# Patient Record
Sex: Female | Born: 1975 | Hispanic: No | Marital: Married | Smoking: Never smoker
Health system: Southern US, Community
[De-identification: ages and names within clinical notes are randomized; demographics above are authoritative.]

## PROBLEM LIST (undated history)

## (undated) ENCOUNTER — Emergency Department (HOSPITAL_COMMUNITY): Payer: No Typology Code available for payment source | Source: Home / Self Care

## (undated) DIAGNOSIS — D249 Benign neoplasm of unspecified breast: Secondary | ICD-10-CM

## (undated) DIAGNOSIS — N979 Female infertility, unspecified: Secondary | ICD-10-CM

## (undated) HISTORY — DX: Female infertility, unspecified: N97.9

## (undated) HISTORY — PX: WISDOM TOOTH EXTRACTION: SHX21

## (undated) HISTORY — DX: Benign neoplasm of unspecified breast: D24.9

## (undated) HISTORY — PX: BREAST CYST ASPIRATION: SHX578

---

## 2009-10-06 ENCOUNTER — Ambulatory Visit: Payer: Self-pay | Admitting: Family Medicine

## 2009-10-06 LAB — CONVERTED CEMR LAB
ALT: 8 units/L (ref 0–35)
AST: 15 units/L (ref 0–37)
Basophils Relative: 1 % (ref 0–1)
Calcium: 9.2 mg/dL (ref 8.4–10.5)
Chloride: 107 meq/L (ref 96–112)
Creatinine, Ser: 0.59 mg/dL (ref 0.40–1.20)
Glucose, Bld: 93 mg/dL (ref 70–99)
HCT: 41.7 % (ref 36.0–46.0)
Hemoglobin: 14.3 g/dL (ref 12.0–15.0)
Lymphocytes Relative: 42 % (ref 12–46)
Lymphs Abs: 2.1 10*3/uL (ref 0.7–4.0)
MCV: 84.6 fL (ref 78.0–100.0)
Monocytes Absolute: 0.4 10*3/uL (ref 0.1–1.0)
Neutro Abs: 2.3 10*3/uL (ref 1.7–7.7)
Neutrophils Relative %: 45 % (ref 43–77)
Prolactin: 26.1 ng/mL
Sodium: 138 meq/L (ref 135–145)
Total Bilirubin: 0.6 mg/dL (ref 0.3–1.2)
WBC: 5 10*3/uL (ref 4.0–10.5)

## 2009-10-25 ENCOUNTER — Encounter (INDEPENDENT_AMBULATORY_CARE_PROVIDER_SITE_OTHER): Payer: Self-pay | Admitting: Family Medicine

## 2009-10-25 LAB — CONVERTED CEMR LAB: Free T4: 0.99 ng/dL (ref 0.80–1.80)

## 2009-12-23 ENCOUNTER — Ambulatory Visit: Payer: Self-pay | Admitting: Obstetrics & Gynecology

## 2009-12-23 LAB — CONVERTED CEMR LAB: Pap Smear: NEGATIVE

## 2009-12-29 ENCOUNTER — Ambulatory Visit (HOSPITAL_COMMUNITY)
Admission: RE | Admit: 2009-12-29 | Discharge: 2009-12-29 | Payer: Self-pay | Source: Home / Self Care | Attending: Family Medicine | Admitting: Family Medicine

## 2009-12-29 ENCOUNTER — Encounter
Admission: RE | Admit: 2009-12-29 | Discharge: 2009-12-29 | Payer: Self-pay | Source: Home / Self Care | Attending: Obstetrics & Gynecology | Admitting: Obstetrics & Gynecology

## 2010-03-03 ENCOUNTER — Ambulatory Visit: Payer: Self-pay | Admitting: Obstetrics and Gynecology

## 2010-03-13 LAB — POCT PREGNANCY, URINE: Preg Test, Ur: NEGATIVE

## 2010-07-28 DIAGNOSIS — N946 Dysmenorrhea, unspecified: Secondary | ICD-10-CM | POA: Insufficient documentation

## 2010-07-28 DIAGNOSIS — IMO0002 Reserved for concepts with insufficient information to code with codable children: Secondary | ICD-10-CM | POA: Insufficient documentation

## 2010-07-28 DIAGNOSIS — E669 Obesity, unspecified: Secondary | ICD-10-CM

## 2010-08-07 ENCOUNTER — Encounter (INDEPENDENT_AMBULATORY_CARE_PROVIDER_SITE_OTHER): Payer: Self-pay | Admitting: Family Medicine

## 2010-08-10 NOTE — Progress Notes (Signed)
This encounter was created in error - please disregard.

## 2012-01-15 ENCOUNTER — Encounter: Payer: Self-pay | Admitting: Obstetrics and Gynecology

## 2012-02-26 ENCOUNTER — Encounter: Payer: Self-pay | Admitting: Obstetrics and Gynecology

## 2012-02-26 ENCOUNTER — Ambulatory Visit: Payer: Self-pay | Admitting: Obstetrics and Gynecology

## 2012-02-26 VITALS — BP 130/82 | Wt 164.0 lb

## 2012-02-26 DIAGNOSIS — N979 Female infertility, unspecified: Secondary | ICD-10-CM

## 2012-02-26 NOTE — Progress Notes (Signed)
Subjective:    Morgan Blankenship is a 37 y.o. female, G0P0, who presents for infertility pt stated last pap was in 2013 wnl .trying to conceive 2006.  Has never used birth control.   Had menses every 29 days, but for the last year have been q 26, 28 or 30 days.  No IM bleeding.  Severe menstrual cramps unrelieved by OTC meds.  Had evaluation in [place] that sounds like HSG that was said to be fine, but no records available.  The following portions of the patient's history were reviewed and updated as appropriate: allergies, current medications, past family history.  Review of Systems Pertinent items are noted in HPI. Breast:Negative for breast lump,nipple discharge or nipple retraction Gastrointestinal: Negative for abdominal pain, change in bowel habits or rectal bleeding Urinary:negative   Objective:    BP 130/82  Wt 164 lb (74.39 kg)  LMP 02/02/2012    Weight:  Wt Readings from Last 1 Encounters:  02/26/12 164 lb (74.39 kg)          BMI: There is no height on file to calculate BMI.  General Appearance: Alert, appropriate appearance for age. No acute distress Breasts:  No masses, no discharge, no skin changes Abdomen:  No masses or organomegaly.  No tenderness GYN exam: Pelvic exam:   VULVA: s/p circumcision, well healed,    VAGINA: normal appearing vagina with normal color and discharge, no lesions,    CERVIX: normal appearing cervix without discharge or lesions,    UTERUS: uterus is normal size, shape, consistency and nontender,    ADNEXA: normal adnexa in size, nontender and no masses, exam limited by patient anxiety.   Assessment:    Primary infertility  AMA Irregular menses Possible anovulation Hx nl pap at [facility]   Plan:   Prolactin TSH Progesterone  GC/CHL F/u 2-3 wks for lab review

## 2012-02-27 LAB — GC/CHLAMYDIA PROBE AMP, URINE
Chlamydia, Swab/Urine, PCR: NEGATIVE
GC Probe Amp, Urine: NEGATIVE

## 2012-02-27 LAB — PROLACTIN: Prolactin: 25.9 ng/mL

## 2012-02-27 LAB — TSH: TSH: 110.171 u[IU]/mL — ABNORMAL HIGH (ref 0.350–4.500)

## 2012-02-29 ENCOUNTER — Other Ambulatory Visit: Payer: Self-pay

## 2012-02-29 ENCOUNTER — Telehealth: Payer: Self-pay | Admitting: Obstetrics and Gynecology

## 2012-02-29 MED ORDER — LEVOTHYROXINE SODIUM 25 MCG PO TABS: 25.0000 ug | ORAL_TABLET | Freq: Every day | ORAL | Status: DC

## 2012-02-29 NOTE — Telephone Encounter (Signed)
CW addressing  Darien Ramus, CMA

## 2012-09-20 ENCOUNTER — Ambulatory Visit (INDEPENDENT_AMBULATORY_CARE_PROVIDER_SITE_OTHER): Payer: BC Managed Care – PPO | Admitting: Family Medicine

## 2012-09-20 VITALS — BP 116/64 | HR 88 | Temp 99.3°F | Resp 17 | Ht 62.0 in | Wt 174.6 lb

## 2012-09-20 DIAGNOSIS — H9202 Otalgia, left ear: Secondary | ICD-10-CM

## 2012-09-20 DIAGNOSIS — H6690 Otitis media, unspecified, unspecified ear: Secondary | ICD-10-CM

## 2012-09-20 DIAGNOSIS — H6692 Otitis media, unspecified, left ear: Secondary | ICD-10-CM

## 2012-09-20 DIAGNOSIS — H9209 Otalgia, unspecified ear: Secondary | ICD-10-CM

## 2012-09-20 MED ORDER — AMOXICILLIN 500 MG PO CAPS: 1000.0000 mg | ORAL_CAPSULE | Freq: Two times a day (BID) | ORAL | Status: DC

## 2012-09-20 NOTE — Patient Instructions (Addendum)

## 2012-09-20 NOTE — Progress Notes (Signed)
   [address]   [place], [place]  [address]   [phone]  Subjective:    Patient ID: Morgan Blankenship, female    DOB: 10/29/1975, 37 y.o.   MRN: [id number]  HPI This 37 y.o. female presents for evaluation of L ear pain.  Onset two days ago.  Had some pain four months ago but improved.  No fever/chills/sweats.  +HA.  No drainage from L ear.  Normal hearing.  No rhinorrhea or nasal congestion.  No sore throat.  No rash.  One Ibuprofen today with improvement.  PCP:  [facility]   Review of Systems  Constitutional: Negative for fever, chills, diaphoresis and fatigue.  HENT: Positive for ear pain. Negative for hearing loss, congestion, sore throat, rhinorrhea, sneezing, neck pain, neck stiffness, postnasal drip and ear discharge.   Respiratory: Negative for cough.    Past Medical History  Diagnosis Date  . Infertility, female   . Fibroadenoma of breast    Past Surgical History  Procedure Laterality Date  . Breast cyst aspiration    . Wisdom tooth extraction     No Known Allergies Current Outpatient Prescriptions on File Prior to Visit  Medication Sig Dispense Refill  . acetaminophen (TYLENOL) 325 MG tablet Take 650 mg by mouth every 6 (six) hours as needed.        Marland Kitchen ibuprofen (ADVIL,MOTRIN) 200 MG tablet Take 200 mg by mouth every 6 (six) hours as needed.        Marland Kitchen levothyroxine (LEVOTHROID) 25 MCG tablet Take 1 tablet (25 mcg total) by mouth daily.  30 tablet  1   No current facility-administered medications on file prior to visit.       Objective:   Physical Exam  Nursing note and vitals reviewed. Constitutional: She is oriented to person, place, and time. She appears well-developed and well-nourished. No distress.  HENT:  Head: Normocephalic and atraumatic.  Right Ear: Tympanic membrane, external ear and ear canal normal.  Left Ear: No drainage or swelling. No foreign bodies. Tympanic membrane is injected and erythematous. Tympanic membrane is not scarred, not perforated, not  retracted and not bulging. A middle ear effusion is present. No decreased hearing is noted.  Nose: Nose normal.  Mouth/Throat: Oropharynx is clear and moist. No oropharyngeal exudate.  Eyes: Conjunctivae and EOM are normal. Pupils are equal, round, and reactive to light.  Neck: Normal range of motion.  L post-auricular LAD.  Cardiovascular: Normal rate and regular rhythm.   Pulmonary/Chest: Effort normal and breath sounds normal.  Lymphadenopathy:    She has cervical adenopathy.  Neurological: She is alert and oriented to person, place, and time.  Skin: Skin is warm and dry. No rash noted. She is not diaphoretic.  Psychiatric: She has a normal mood and affect. Her behavior is normal.       Assessment & Plan:  Otalgia, left  Otitis media, left  1. L otalgia/otitis media;  New. Rx for Amoxicillin provided; continue Ibuprofen but increase to 400mg  PRN pain.  RTC for acute worsening, decreased hearing, drainage from ear.  Meds ordered this encounter  Medications  . amoxicillin (AMOXIL) 500 MG capsule    Sig: Take 2 capsules (1,000 mg total) by mouth 2 (two) times daily.    Dispense:  40 capsule    Refill:  0

## 2013-04-16 ENCOUNTER — Other Ambulatory Visit: Payer: Self-pay | Admitting: Obstetrics and Gynecology

## 2013-04-16 DIAGNOSIS — Z3141 Encounter for fertility testing: Secondary | ICD-10-CM

## 2013-04-22 ENCOUNTER — Encounter (INDEPENDENT_AMBULATORY_CARE_PROVIDER_SITE_OTHER): Payer: Self-pay

## 2013-04-22 ENCOUNTER — Ambulatory Visit (HOSPITAL_COMMUNITY)
Admission: RE | Admit: 2013-04-22 | Discharge: 2013-04-22 | Disposition: A | Discharge status: Home / Self Care | Payer: No Typology Code available for payment source | Source: Ambulatory Visit | Attending: Obstetrics and Gynecology | Admitting: Obstetrics and Gynecology

## 2013-04-22 DIAGNOSIS — Z3141 Encounter for fertility testing: Secondary | ICD-10-CM

## 2013-04-22 DIAGNOSIS — N979 Female infertility, unspecified: Secondary | ICD-10-CM | POA: Insufficient documentation

## 2013-04-22 MED ORDER — IOHEXOL 300 MG/ML  SOLN
20.0000 mL | Freq: Once | INTRAMUSCULAR | Status: AC | PRN
  Administered 2013-04-22: 20 mL

## 2016-08-16 ENCOUNTER — Emergency Department (HOSPITAL_COMMUNITY)
Admission: EM | Admit: 2016-08-16 | Discharge: 2016-08-16 | Disposition: A | Discharge status: Home / Self Care | Payer: Self-pay | Attending: Emergency Medicine | Admitting: Emergency Medicine

## 2016-08-16 ENCOUNTER — Emergency Department (HOSPITAL_COMMUNITY): Payer: Self-pay

## 2016-08-16 ENCOUNTER — Encounter (HOSPITAL_COMMUNITY): Payer: Self-pay

## 2016-08-16 DIAGNOSIS — L0231 Cutaneous abscess of buttock: Secondary | ICD-10-CM | POA: Insufficient documentation

## 2016-08-16 LAB — CBC WITH DIFFERENTIAL/PLATELET
Basophils Absolute: 0 10*3/uL (ref 0.0–0.1)
Basophils Relative: 0 %
EOS ABS: 0.1 10*3/uL (ref 0.0–0.7)
EOS PCT: 1 %
HCT: 39.5 % (ref 36.0–46.0)
HEMOGLOBIN: 13.6 g/dL (ref 12.0–15.0)
LYMPHS ABS: 2.2 10*3/uL (ref 0.7–4.0)
Lymphocytes Relative: 25 %
MCH: 28.5 pg (ref 26.0–34.0)
MCHC: 34.4 g/dL (ref 30.0–36.0)
MCV: 82.6 fL (ref 78.0–100.0)
MONO ABS: 0.7 10*3/uL (ref 0.1–1.0)
MONOS PCT: 7 %
Neutro Abs: 6 10*3/uL (ref 1.7–7.7)
Neutrophils Relative %: 67 %
Platelets: 387 10*3/uL (ref 150–400)
RBC: 4.78 MIL/uL (ref 3.87–5.11)
RDW: 13.7 % (ref 11.5–15.5)
WBC: 9 10*3/uL (ref 4.0–10.5)

## 2016-08-16 LAB — I-STAT BETA HCG BLOOD, ED (MC, WL, AP ONLY)

## 2016-08-16 LAB — COMPREHENSIVE METABOLIC PANEL
ALT: 12 U/L — AB (ref 14–54)
AST: 24 U/L (ref 15–41)
Albumin: 3.8 g/dL (ref 3.5–5.0)
Alkaline Phosphatase: 98 U/L (ref 38–126)
Anion gap: 9 (ref 5–15)
BUN: 7 mg/dL (ref 6–20)
CHLORIDE: 107 mmol/L (ref 101–111)
CO2: 22 mmol/L (ref 22–32)
CREATININE: 0.65 mg/dL (ref 0.44–1.00)
Calcium: 9.4 mg/dL (ref 8.9–10.3)
GFR calc Af Amer: >60 mL/min (ref >60)
GLUCOSE: 115 mg/dL — AB (ref 65–99)
Potassium: 3.3 mmol/L — ABNORMAL LOW (ref 3.5–5.1)
Sodium: 138 mmol/L (ref 135–145)
Total Bilirubin: 0.6 mg/dL (ref 0.3–1.2)
Total Protein: 7.9 g/dL (ref 6.5–8.1)

## 2016-08-16 MED ORDER — CLINDAMYCIN HCL 150 MG PO CAPS
300.0000 mg | ORAL_CAPSULE | Freq: Once | ORAL | Status: AC
  Administered 2016-08-16: 300 mg via ORAL
  Filled 2016-08-16: qty 2

## 2016-08-16 MED ORDER — LEVOTHYROXINE SODIUM 50 MCG PO TABS: 50.0000 ug | ORAL_TABLET | Freq: Every day | ORAL | 0 refills | Status: AC

## 2016-08-16 MED ORDER — CLINDAMYCIN HCL 150 MG PO CAPS: 300.0000 mg | ORAL_CAPSULE | Freq: Three times a day (TID) | ORAL | 0 refills | Status: AC

## 2016-08-16 MED ORDER — HYDROCODONE-ACETAMINOPHEN 5-325 MG PO TABS
1.0000 | ORAL_TABLET | Freq: Once | ORAL | Status: AC
  Administered 2016-08-16: 1 via ORAL
  Filled 2016-08-16: qty 1

## 2016-08-16 MED ORDER — LIDOCAINE-EPINEPHRINE 1 %-1:100000 IJ SOLN: 10.0000 mL | Freq: Once | INTRAMUSCULAR | Status: DC

## 2016-08-16 MED ORDER — HYDROCODONE-ACETAMINOPHEN 5-325 MG PO TABS
1.0000 | ORAL_TABLET | Freq: Four times a day (QID) | ORAL | 0 refills | Status: AC | PRN
Start: 2016-08-16 — End: ?

## 2016-08-16 MED ORDER — IOPAMIDOL (ISOVUE-300) INJECTION 61%
INTRAVENOUS | Status: AC
  Administered 2016-08-16: 50 mL via INTRAVENOUS
  Filled 2016-08-16: qty 50

## 2016-08-16 MED ORDER — MORPHINE SULFATE (PF) 4 MG/ML IV SOLN
4.0000 mg | Freq: Once | INTRAVENOUS | Status: AC
  Administered 2016-08-16: 4 mg via INTRAVENOUS
  Filled 2016-08-16: qty 1

## 2016-08-16 NOTE — ED Notes (Signed)
Patient is resting with family at bedside  

## 2016-08-16 NOTE — ED Provider Notes (Signed)
[facility] DEPT Provider Note   CSN: [id number] Arrival date & time: 08/16/16  9702     History   Chief Complaint Chief Complaint  Patient presents with  . Abscess    HPI Morgan Blankenship is a 41 y.o. female.  41 year old female who presents with buttock abscess. 4 days ago, the patient began having pain and swelling on her left buttock. The pain has worsened since it began and is now 9/10 in intensity and constant. She reports no problems with bowel movements, no fevers, vomiting, or abdominal pain. This is never happened before. No medications prior to arrival.   The history is provided by the patient and the spouse.  Abscess    Past Medical History:  Diagnosis Date  . Fibroadenoma of breast   . Infertility, female     Patient Active Problem List   Diagnosis Date Noted  . Fibroadenoma of breast   . Infertility, female   . Dyspareunia 07/28/2010  . Dysmenorrhea 07/28/2010  . Obesity 07/28/2010    Past Surgical History:  Procedure Laterality Date  . BREAST CYST ASPIRATION    . WISDOM TOOTH EXTRACTION      OB History    Gravida Para Term Preterm AB Living   0             SAB TAB Ectopic Multiple Live Births                   Home Medications    Prior to Admission medications   Medication Sig Start Date End Date Taking? Authorizing Provider  ibuprofen (ADVIL,MOTRIN) 200 MG tablet Take 200 mg by mouth every 6 (six) hours as needed for mild pain.    Yes [provider]  clindamycin (CLEOCIN) 150 MG capsule Take 2 capsules (300 mg total) by mouth 3 (three) times daily. 08/16/16 08/23/16  Little, Wenda Overland, MD  HYDROcodone-acetaminophen (NORCO/VICODIN) 5-325 MG tablet Take 1-2 tablets by mouth every 6 (six) hours as needed. 08/16/16   Little, Wenda Overland, MD  levothyroxine (SYNTHROID, LEVOTHROID) 50 MCG tablet Take 1 tablet (50 mcg total) by mouth daily before breakfast. 08/16/16   Little, Wenda Overland, MD    Family History History reviewed.  No pertinent family history.  Social History Social History  Substance Use Topics  . Smoking status: Never Smoker  . Smokeless tobacco: Never Used  . Alcohol use No     Allergies   Patient has no known allergies.   Review of Systems Review of Systems All other systems reviewed and are negative except that which was mentioned in HPI   Physical Exam Updated Vital Signs BP 112/79   Pulse 98   Temp 98.4 F (36.9 C) (Oral)   Resp 16   LMP 08/16/2016 (Exact Date)   SpO2 98%   Physical Exam  Constitutional: She is oriented to person, place, and time. She appears well-developed and well-nourished. No distress.  HENT:  Head: Normocephalic and atraumatic.  Moist mucous membranes  Eyes: Conjunctivae are normal.  Neck: Neck supple.  Cardiovascular: Regular rhythm and normal heart sounds.  Tachycardia present.   No murmur heard. Pulmonary/Chest: Effort normal and breath sounds normal.  Abdominal: Soft. Bowel sounds are normal. She exhibits no distension. There is no tenderness.  Genitourinary:  Genitourinary Comments: Large area of fluctuance and tenderness on medial left buttock extending to anus, with fluctuance extending into rectal wall, no drainage; small non-thrombosed, non-bleeding external hemorrhoids  Musculoskeletal: She exhibits no edema.  Neurological: She is alert and  oriented to person, place, and time.  Normal gait, Fluent speech  Skin: Skin is warm and dry. No erythema.  Psychiatric: Judgment normal.  Mildly anxious but pleasant and cooperative  Nursing note and vitals reviewed.    ED Treatments / Results  Labs (all labs ordered are listed, but only abnormal results are displayed) Labs Reviewed  COMPREHENSIVE METABOLIC PANEL - Abnormal; Notable for the following:       Result Value   Potassium 3.3 (*)    Glucose, Bld 115 (*)    ALT 12 (*)    All other components within normal limits  CBC WITH DIFFERENTIAL/PLATELET  I-STAT BETA HCG BLOOD, ED ([facility], [facility],  [facility] ONLY)    EKG  EKG Interpretation None       Radiology Ct Pelvis W Contrast  Result Date: 08/16/2016 CLINICAL DATA:  Right buttocks pain and possible abscess. EXAM: CT PELVIS WITH CONTRAST TECHNIQUE: Multidetector CT imaging of the pelvis was performed using the standard protocol following the bolus administration of intravenous contrast. CONTRAST:  50 mL ISOVUE-300 IOPAMIDOL (ISOVUE-300) INJECTION 61% COMPARISON:  None. FINDINGS: Urinary Tract:  No abnormality visualized. Bowel:  Unremarkable visualized pelvic bowel loops. Vascular/Lymphatic: No pathologically enlarged lymph nodes. No significant vascular abnormality seen. Reproductive:  No mass or other significant abnormality Other: Inflammatory stranding is seen along the medial portion of the right buttocks inferior to the anus consistent with focal cellulitis. No definite fluid collection or abscess is noted. Musculoskeletal: No suspicious bone lesions identified. IMPRESSION: Focal cellulitis is seen involving the medial portion of the right buttocks inferior to the anus. No definite fluid collection or abscess is noted at this time. Electronically Signed   By: Marijo Conception, M.D.   On: 08/16/2016 13:03    Procedures .Marland KitchenIncision and Drainage Date/Time: 08/16/2016 5:07 PM Performed by: Kathi Ludwig Authorized by: Sharlett Iles   Consent:    Consent obtained:  Verbal   Consent given by:  Patient   Risks discussed:  Bleeding (scarring) Location:    Type:  Abscess   Size:  3cm   Location:  Anogenital   Anogenital location:  Gluteal cleft (right buttock) Pre-procedure details:    Skin preparation:  Betadine Anesthesia (see MAR for exact dosages):    Anesthesia method:  Local infiltration   Local anesthetic:  Lidocaine 2% WITH epi Procedure type:    Complexity:  Complex Procedure details:    Incision types:  Single straight   Incision depth:  Dermal   Scalpel blade:  11   Wound management:  Probed and  deloculated and irrigated with saline   Drainage:  Purulent   Drainage amount:  Copious   Wound treatment:  Wound left open   Packing materials:  1/4 in gauze   Amount 1/4":  15 cm Post-procedure details:    Patient tolerance of procedure:  Tolerated well, no immediate complications   (including critical care time)  Medications Ordered in ED Medications  morphine 4 MG/ML injection 4 mg (4 mg Intravenous Given 08/16/16 1013)  iopamidol (ISOVUE-300) 61 % injection (50 mLs Intravenous Contrast Given 08/16/16 1238)  HYDROcodone-acetaminophen (NORCO/VICODIN) 5-325 MG per tablet 1 tablet (1 tablet Oral Given 08/16/16 1537)  clindamycin (CLEOCIN) capsule 300 mg (300 mg Oral Given 08/16/16 1537)     Initial Impression / Assessment and Plan / ED Course  I have reviewed the triage vital signs and the nursing notes.  Pertinent labs & imaging results that were available during my care of the patient were reviewed  by me and considered in my medical decision making (see chart for details).    This is a pleasant 41yo F who p/w 4 days worsening pain and swelling of left buttock. Exam suggestive of buttock abscess but concern for possible extension in peri-rectal space given proximity to anus. Recommended imaging for better characterization prior to drainage. Gave morphine and obtained CT pelvis.  Labwork reassuring with normal WBC count. CT shows cellulitis changes of inferior right buttock which do not appear to be adjacent to rectal wall. No obvious fluid collection, however bedside ultrasound does show fluid within the tissues and appearance on exam with fluctuance is consistent with abscess. Performed incision and drainage, see procedure note for details. Copious pus on exam. Because of location and risk of complication or worsening infection, I did give her a course of clindamycin. I discussed supportive measures including how to manage packing and extensively reviewed return precautions with the  patient and her husband. They voiced understanding and patient was discharged in satisfactory condition.  Final Clinical Impressions(s) / ED Diagnoses   Final diagnoses:  Abscess of right buttock    New Prescriptions Discharge Medication List as of 08/16/2016  3:13 PM    START taking these medications   Details  clindamycin (CLEOCIN) 150 MG capsule Take 2 capsules (300 mg total) by mouth 3 (three) times daily., Starting Thu 08/16/2016, Until Thu 08/23/2016, Print    HYDROcodone-acetaminophen (NORCO/VICODIN) 5-325 MG tablet Take 1-2 tablets by mouth every 6 (six) hours as needed., Starting Thu 08/16/2016, Print         Little, Wenda Overland, MD 08/16/16 1711

## 2016-08-16 NOTE — ED Triage Notes (Signed)
Pt has abscess to the inside of her right buttock. Pt states pain is severe. Pt mildly tachycardic in triage. No drainage noted. Pt afebrile.

## 2017-08-17 ENCOUNTER — Other Ambulatory Visit: Payer: Self-pay | Admitting: Internal Medicine

## 2017-08-17 DIAGNOSIS — N979 Female infertility, unspecified: Secondary | ICD-10-CM

## 2017-08-17 NOTE — Progress Notes (Signed)
Created in error

## 2017-09-07 ENCOUNTER — Ambulatory Visit: Payer: Self-pay | Admitting: Internal Medicine

## 2017-09-21 ENCOUNTER — Ambulatory Visit: Payer: Self-pay | Admitting: Internal Medicine

## 2019-03-07 IMAGING — CT CT PELVIS W/ CM
2 of 3 series · 16 of 46 positions shown, 18 images · IV contrast (iopamidol)
Comparison: None.

CLINICAL DATA: Right buttocks pain and possible abscess.

EXAM:
CT PELVIS WITH CONTRAST
TECHNIQUE: Multidetector CT imaging of the pelvis was performed using the
standard protocol following the bolus administration of intravenous
contrast.
CONTRAST:  50 mL R7EUQU-DHH IOPAMIDOL (R7EUQU-DHH) INJECTION 61%

[Series 5: soft tissue · axial · 0.76mm/px · z∈[-919,-675]mm · 13 of 140 slices shown, 15 images]
[im 9/140  soft-tissue]
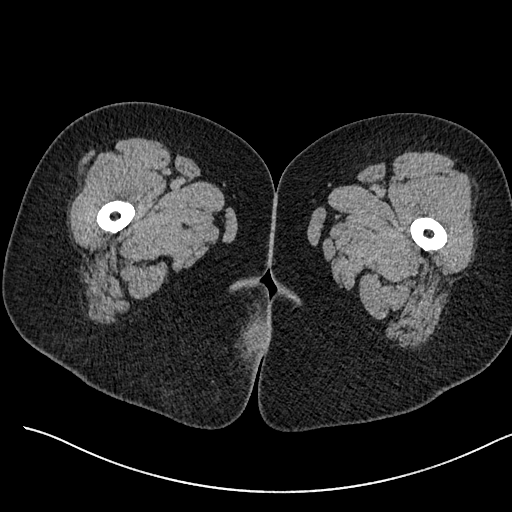
[im 9/140  bone]
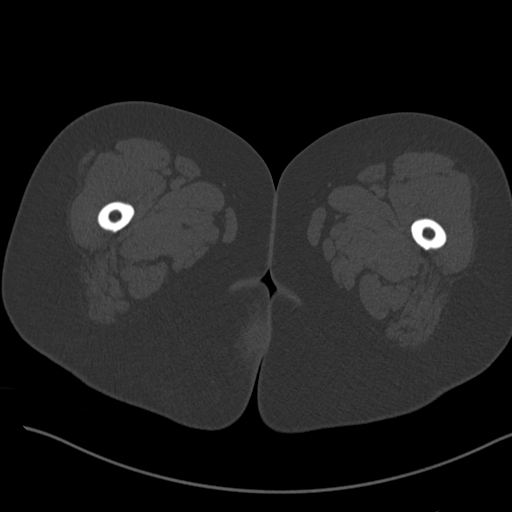
[im 18/140  soft-tissue]
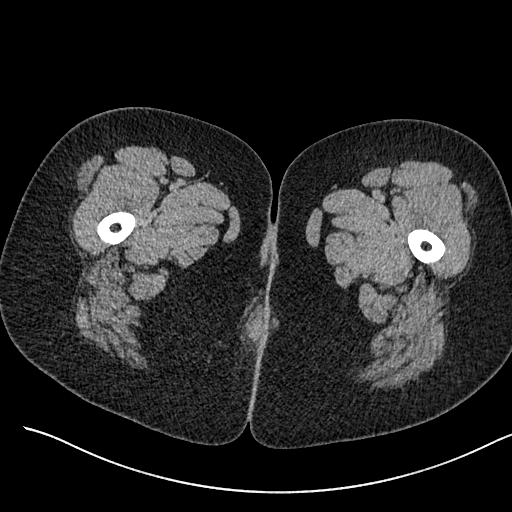
[im 27/140  soft-tissue]
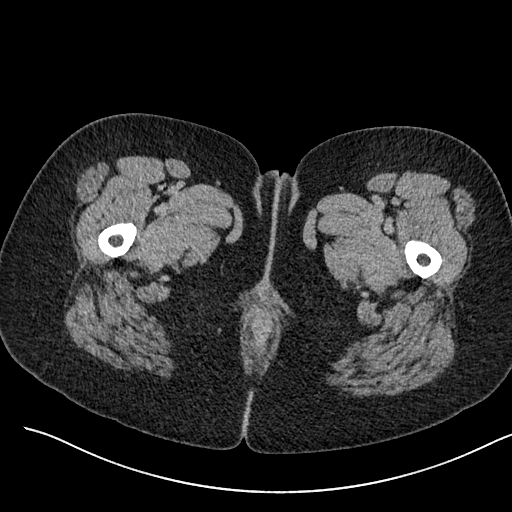
[im 41/140  soft-tissue]
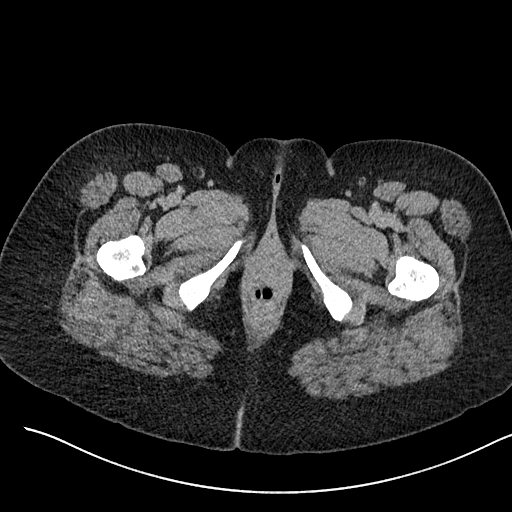
[im 50/140  soft-tissue]
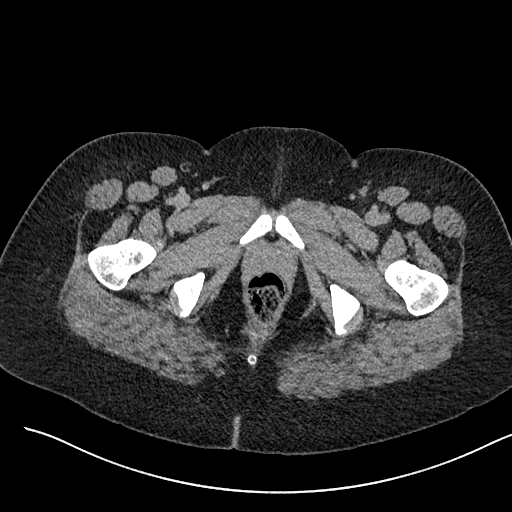
[im 59/140  soft-tissue]
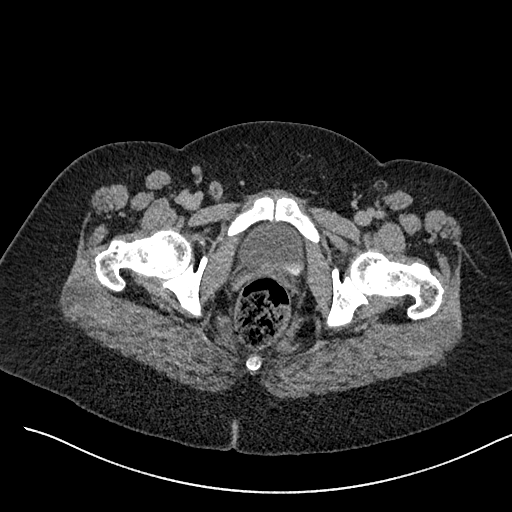
[im 72/140  soft-tissue]
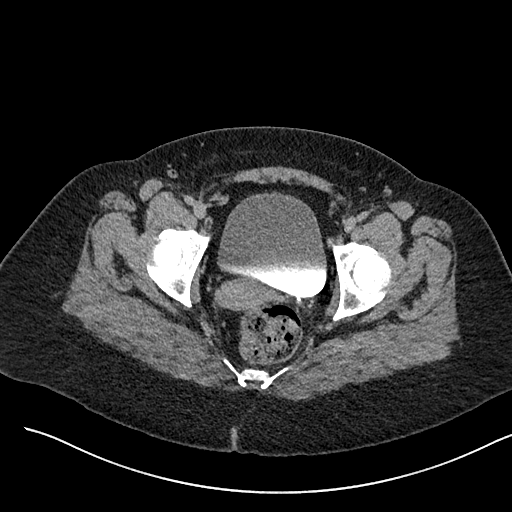
[im 81/140  soft-tissue]
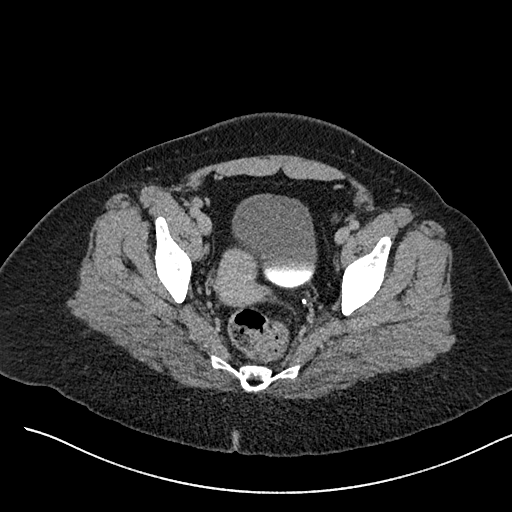
[im 90/140  soft-tissue]
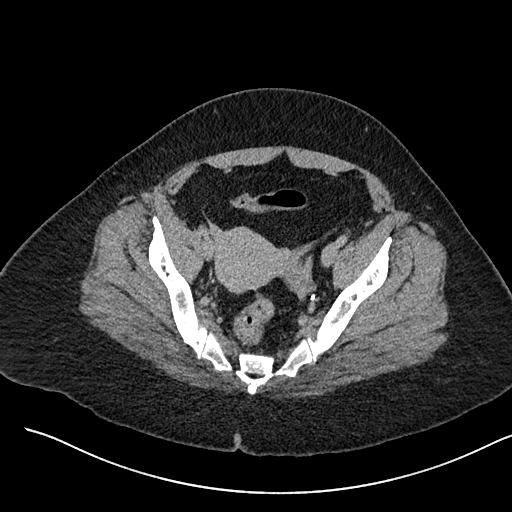
[im 90/140  bone]
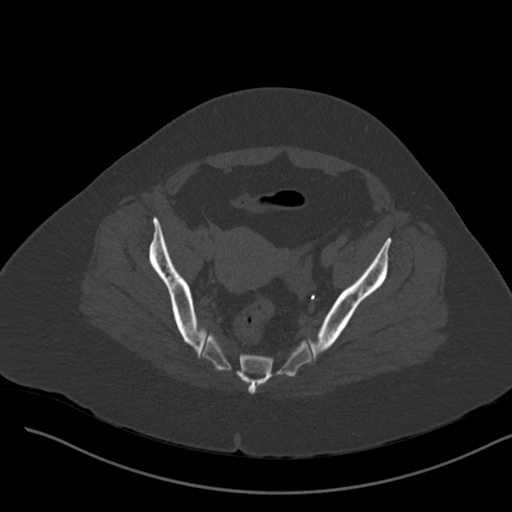
[im 99/140  soft-tissue]
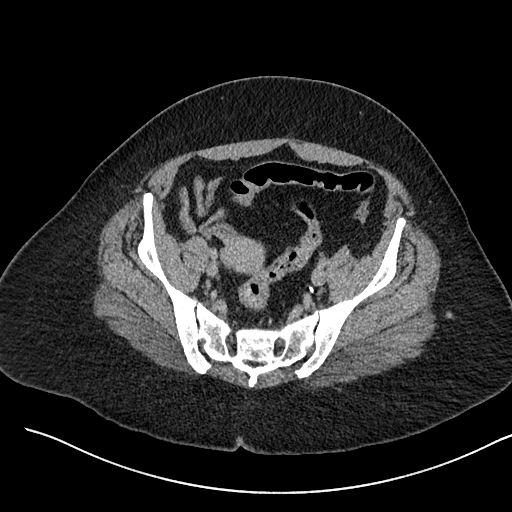
[im 113/140  soft-tissue]
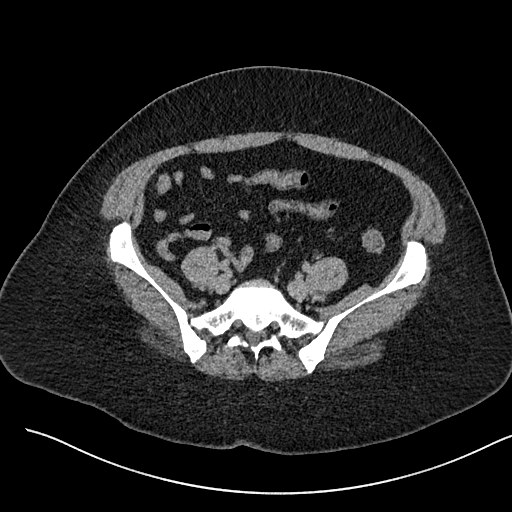
[im 122/140  soft-tissue]
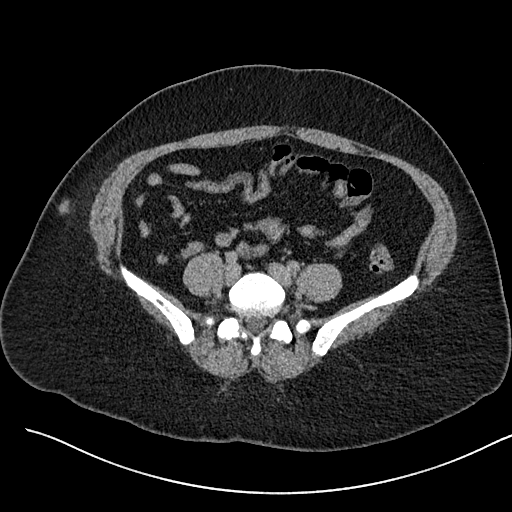
[im 131/140  soft-tissue]
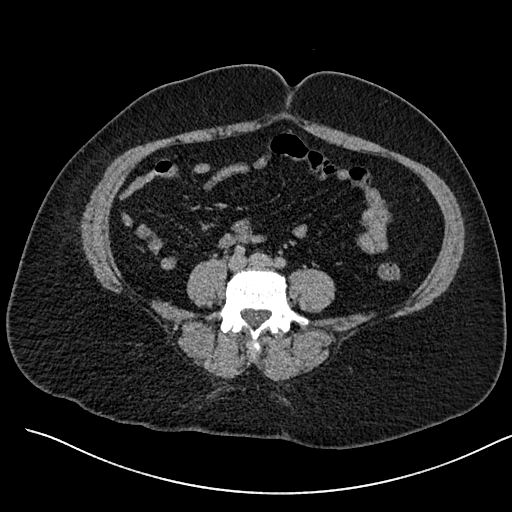

[Series 6: cor soft · coronal · 0.55mm/px · 3 of 146 slices shown]
[im 49/146  soft-tissue]
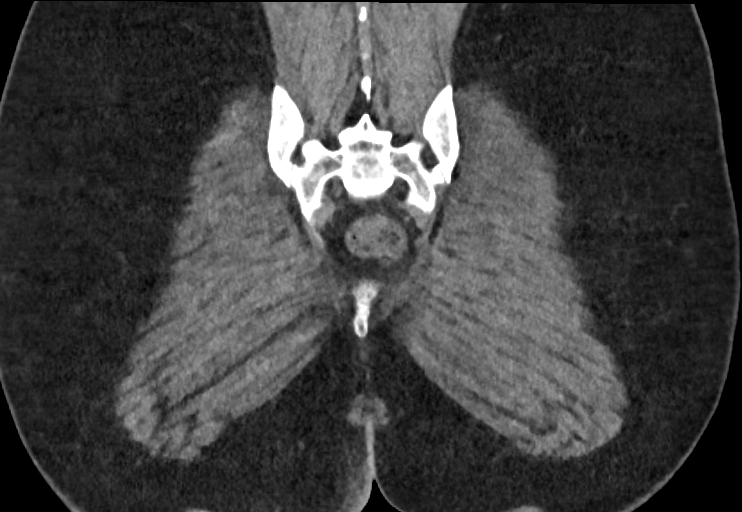
[im 65/146  soft-tissue]
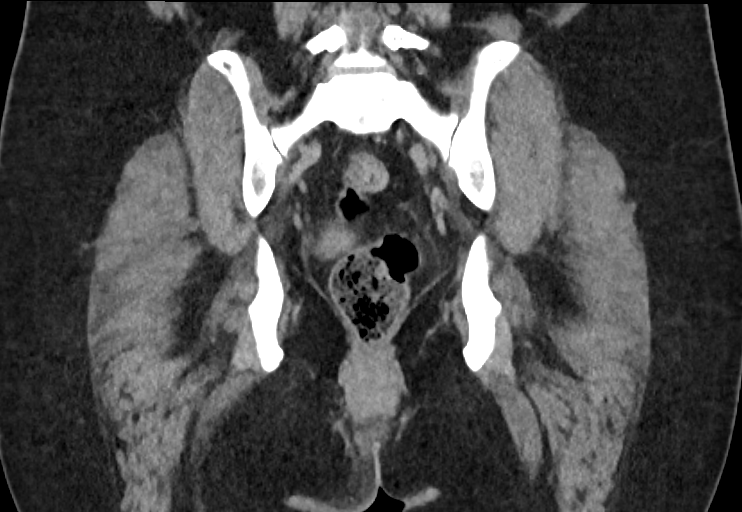
[im 81/146  soft-tissue]
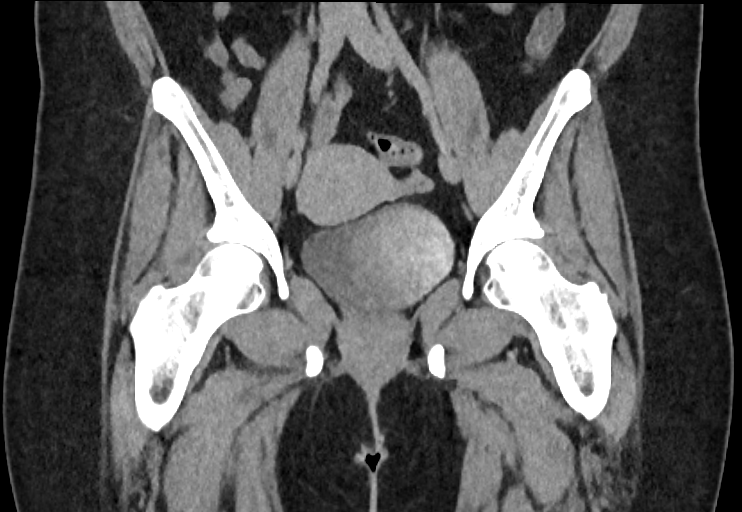

[16 of 46 positions shown; findings below may reference images not displayed]

FINDINGS: Urinary Tract:  No abnormality visualized.

Bowel:  Unremarkable visualized pelvic bowel loops.

Vascular/Lymphatic: No pathologically enlarged lymph nodes. No
significant vascular abnormality seen.

Reproductive:  No mass or other significant abnormality

Other: Inflammatory stranding is seen along the medial portion of
the right buttocks inferior to the anus consistent with focal
cellulitis. No definite fluid collection or abscess is noted.

Musculoskeletal: No suspicious bone lesions identified.
IMPRESSION: Focal cellulitis is seen involving the medial portion of the right
buttocks inferior to the anus. No definite fluid collection or
abscess is noted at this time.

## 2019-03-11 ENCOUNTER — Ambulatory Visit: Payer: Self-pay | Admitting: Adult Health Nurse Practitioner
